# Patient Record
Sex: Female | Born: 1960 | Race: White | Hispanic: No | Marital: Married | Smoking: Never smoker
Health system: Southern US, Community
[De-identification: ages and names within clinical notes are randomized; demographics above are authoritative.]

## PROBLEM LIST (undated history)

## (undated) DIAGNOSIS — F419 Anxiety disorder, unspecified: Secondary | ICD-10-CM

## (undated) DIAGNOSIS — K5090 Crohn's disease, unspecified, without complications: Secondary | ICD-10-CM

## (undated) DIAGNOSIS — G709 Myoneural disorder, unspecified: Secondary | ICD-10-CM

## (undated) DIAGNOSIS — D649 Anemia, unspecified: Secondary | ICD-10-CM

## (undated) DIAGNOSIS — F32A Depression, unspecified: Secondary | ICD-10-CM

## (undated) DIAGNOSIS — F329 Major depressive disorder, single episode, unspecified: Secondary | ICD-10-CM

## (undated) DIAGNOSIS — M1990 Unspecified osteoarthritis, unspecified site: Secondary | ICD-10-CM

## (undated) HISTORY — PX: APPENDECTOMY: SHX54

## (undated) HISTORY — DX: Anxiety disorder, unspecified: F41.9

## (undated) HISTORY — PX: TUBAL LIGATION: SHX77

## (undated) HISTORY — DX: Unspecified osteoarthritis, unspecified site: M19.90

## (undated) HISTORY — DX: Depression, unspecified: F32.A

## (undated) HISTORY — PX: BREAST SURGERY: SHX581

## (undated) HISTORY — DX: Major depressive disorder, single episode, unspecified: F32.9

## (undated) HISTORY — DX: Anemia, unspecified: D64.9

## (undated) HISTORY — DX: Crohn's disease, unspecified, without complications: K50.90

## (undated) HISTORY — DX: Myoneural disorder, unspecified: G70.9

## (undated) HISTORY — PX: ABDOMINAL HYSTERECTOMY: SHX81

---

## 2015-04-11 ENCOUNTER — Ambulatory Visit

## 2015-04-11 ENCOUNTER — Ambulatory Visit (INDEPENDENT_AMBULATORY_CARE_PROVIDER_SITE_OTHER): Admitting: Family Medicine

## 2015-04-11 VITALS — BP 114/80 | HR 104 | Temp 98.4°F | Resp 18 | Ht 69.0 in | Wt 170.0 lb

## 2015-04-11 DIAGNOSIS — M542 Cervicalgia: Secondary | ICD-10-CM | POA: Diagnosis not present

## 2015-04-11 DIAGNOSIS — M25522 Pain in left elbow: Secondary | ICD-10-CM | POA: Diagnosis not present

## 2015-04-11 DIAGNOSIS — S3992XA Unspecified injury of lower back, initial encounter: Secondary | ICD-10-CM

## 2015-04-11 DIAGNOSIS — M545 Low back pain, unspecified: Secondary | ICD-10-CM

## 2015-04-11 DIAGNOSIS — M25512 Pain in left shoulder: Secondary | ICD-10-CM

## 2015-04-11 DIAGNOSIS — M25529 Pain in unspecified elbow: Secondary | ICD-10-CM

## 2015-04-11 DIAGNOSIS — M25521 Pain in right elbow: Secondary | ICD-10-CM

## 2015-04-11 DIAGNOSIS — W19XXXA Unspecified fall, initial encounter: Secondary | ICD-10-CM

## 2015-04-11 MED ORDER — CYCLOBENZAPRINE HCL 5 MG PO TABS: ORAL_TABLET | ORAL | Status: DC

## 2015-04-11 MED ORDER — HYDROCODONE-ACETAMINOPHEN 5-325 MG PO TABS
1.0000 | ORAL_TABLET | Freq: Four times a day (QID) | ORAL | Status: DC | PRN
Start: 2015-04-11 — End: 2015-04-20

## 2015-04-11 NOTE — Patient Instructions (Signed)
Your shoulder x-ray, elbow x-rays, low back x-ray did not show any apparent fractures, but we will have the radiologist read these as well tonight. There is some arthritis in one area of your neck, with questionable movement around that area, but I will also have this evaluated by the radiologist tonight. For now, wear the shoulder sling on your left side, wear the cervical collar to help protect your neck in case there is an acute injury, and follow-up with me Thursday at 3 PM. If there is any change in this plan once the radiologist reads your x-rays, we will call you. If you have any worsening of your symptoms prior to follow-up visit, return here or the emergency room.  For now you can take Flexeril up to every 8 hours as needed for muscle spasm (do not combine this with Robaxin), then if needed for more severe pain, can take the hydrocodone. Be very careful as both these medicines can cause sedation and dizziness.

## 2015-04-11 NOTE — Progress Notes (Addendum)
Subjective:  By signing my name below, I, Rawaa Al Rifaie, attest that this documentation has been prepared under the direction and in the presence of [name], MD.  Broadus John, Medical Scribe. 04/11/2015.  3:40 PM. I personally performed the services described in this documentation, which was scribed in my presence. The recorded information has been reviewed and considered, and addended by me as needed.      Patient ID: Kari Rocha, female    DOB: 12-05-1960, 54 y.o.   MRN: [id number]  Chief Complaint  Patient presents with  . Fall    12/12  . Back Injury  . Neck Injury  . Shoulder Injury    both   . Elbow Injury    left worse than rt    HPI HPI Comments: Kari Rocha is a 54 y.o. female who presents to [facility] complaining of injuries secondary to a fall that occurred at work yesterday around 4 pm.  She indicates that as she was walking, she slid on wet leaves, she fell backwards and she first noticed pain to her tailbone. She did not need to call EMS or go to the hospital, rather she was able to go back to work. Pt notes that she was still able to walk and use her arms and neck after the onset of the injury. She indicates that she progressively started to experience tenderness to the lower back, left shoulder, left arm, BL elbow, left hip pain as she is raising her left leg, numbness to the third, fourth and fifth fingers on the right hand that started after few hours of the onset of injuries. She reports that she applied heat to her neck area which was the sorest area after the incident, as well as taking Robaxin 2 pill after onset of injury, and 2 dosages at night time, with very mild relief. Pt denies bowel or bladder incontinence, genitals numbness, nausea, vomiting, head impact at onset, or LOC. She reports no hx of surgeries or fractures to the back. Pt is right handed dominate.  Pt has hx of psoriatic arthritis that she usually experiences in  her hands. She denies previous issues to the currently affected areas with this disease. Pt also has a hx of Crohn's disease, theretofore she is not able to take NSAIDs.   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 Authorizing Provider  methocarbamol (ROBAXIN) 500 MG tablet Take 500 mg by mouth 3 (three) times daily.   Yes Historical Provider, MD    No Known Allergies   Review of Systems  Gastrointestinal: Negative for nausea and vomiting.  Genitourinary: Negative for enuresis.  Musculoskeletal: Positive for myalgias, back pain, arthralgias, neck pain and neck stiffness.  Neurological: Positive for numbness.      Objective:   Physical Exam  Constitutional: She is oriented to person, place, and time. She appears well-developed and well-nourished. No distress.  HENT:  Head: Normocephalic and atraumatic.  Eyes: EOM are normal. Pupils are equal, round, and reactive to light.  Neck: Neck supple.  Cardiovascular: Normal rate.   Pulmonary/Chest: Effort normal.  Musculoskeletal:  Cervical spine-Tender along the right and left paraspinal, right greater than left. Slight tend along the lower cervical spine midline. Guarded with flexion with only 35 degrees. Guarded with extension 30-40 degrees. 30-40 degrees with rotation to the left or right. Guarded with lateral flexion.  Lumbar  spine- tenderness of the lower L spine and the sacrum midline. Paraspinal tenderness of the L1-L2 BL, no spasms. Flexion is guarded to 40-45 degrees. Guarded with lateral flexion. Rotation is equal. Negative seated straight leg raise. Skin is intact. No bruising. Left shoulder- [place] joint, clavicle, and AC joint are non-tender. Tender along the anterior shoulder soft tissue. Skin is intact, no erythema or ecchymosis. Minimal flexion approximately 30-35 degrees. Pain free with extension. Approximately 35 degrees with abduction, otherwise  guarded exam.  Right shoulder - Pain free ROM. No bony tenderness.  Left Elbow- FROM. Tender along the medial epicondyle, remainder of the elbow is non tender.  Right elbow- FROM. Slight bony tenderness along the medial epicondyle.  Left hand- Equal grip strength. Wrist is non tender. Able to heel and toe walk without difficulty.  Neurological: She is alert and oriented to person, place, and time. No cranial nerve deficit. She displays no Babinski's sign on the right side. She displays no Babinski's sign on the left side.  Reflex Scores:      Tricep reflexes are 2+ on the right side and 2+ on the left side.      Bicep reflexes are 2+ on the right side and 2+ on the left side.      Brachioradialis reflexes are 2+ on the right side and 2+ on the left side.      Patellar reflexes are 2+ on the right side and 2+ on the left side.      Achilles reflexes are 2+ on the right side and 2+ on the left side. nvi distally to fingertips.   Skin: Skin is warm and dry.  Psychiatric: She has a normal mood and affect. Her behavior is normal.  Nursing note and vitals reviewed.   Filed Vitals:   04/11/15 1457  BP: 114/80  Pulse: 104  Temp: 98.4 F (36.9 C)  TempSrc: Oral  Resp: 18  Height: 5\' 9"  (1.753 m)  Weight: 170 lb (77.111 kg)  SpO2: 97%    [facility] (PRIMARY) x-ray report read by Dr. Meredith StaggersJeffrey Bernadett Milian, MD:  Cervical spine with flexion/ extension views- Degenerative changes with decreased dics space C5-6. No appreciable movement with flexion and extension views but subtle possible listhesis C4 on 5.  Lumbar spine- lower degenerative changes, and decreased disc space L3-4 L4-5 with slight retrolisthesis of L3 on L4.  Pelvic/ Coccyx- Difficult to visualize, but no apparent displaced coccyx fracture.  Left shoulder- There are radioopaque patches through the upper humerus, but no apparent fracture at the glenohumeral joint.  Right elbow- No apparent fracture.  Left elbow- No apparent fracture.        Assessment & Plan:   Kari Rocha is a 54 y.o. female Fall, initial encounter - Plan: HYDROcodone-acetaminophen (NORCO/VICODIN) 5-325 MG tablet  -Due to injury at work. Minimal initial symptoms, was able to continue to work for first hour 2, which is reassuring, however progression of pain to her shoulder,  Neck, elbows and low back/tailbone.   Bilateral elbow joint pain, unspecified laterality - Plan: DG Elbow Complete Right, DG Elbow Complete Left, HYDROcodone-acetaminophen (NORCO/VICODIN) 5-325 MG tablet  -No apparent fracture. Symptomatic care.  Left shoulder pain - Plan: DG Shoulder Left, Care order/instruction, HYDROcodone-acetaminophen (NORCO/VICODIN) 5-325 MG tablet  -Strain of RTC possible. No apparent fracture on initial x-ray. Shoulder sling placed, recheck in next 1-2 days. Hydrocodone if needed for breakthrough pain.  Neck pain - Plan: DG Cervical Spine Complete, DG Cerv Spine Flex&Ext Only, cyclobenzaprine (FLEXERIL) 5 MG tablet,  HYDROcodone-acetaminophen (NORCO/VICODIN) 5-325 MG tablet  -Suspected underlying degenerative disease, but report was sent to radiology, placed in rigid neck collar for now. Flexeril prescribed if needed, and hydrocodone for breakthrough pain. Side effects discussed and cautioned on combining these 2 medications. Do not take flexeril with robaxin.  Recheck in the next 2 days.  Bilateral low back pain without sciatica - Plan: DG Lumbar Spine Complete, HYDROcodone-acetaminophen (NORCO/VICODIN) 5-325 MG tablet Tailbone injury, initial encounter - Plan: DG Sacrum/Coccyx, HYDROcodone-acetaminophen (NORCO/VICODIN) 5-325 MG tablet  -Coccydynia/tailbone contusion likely with secondary lumbar strain from fall. Symptomatic care with Flexeril, hydrocodone only if needed.  Recheck in 2 days.   Out of work at present due to multiple injuries as above and sedating medications prescribed, and recheck in 2 days. Letter provided.   Meds ordered this encounter   Medications  . cyclobenzaprine (FLEXERIL) 5 MG tablet    Sig: 1 pill by mouth up to every 8 hours as needed. Start with one pill by mouth each bedtime as needed due to sedation    Dispense:  15 tablet    Refill:  0  . HYDROcodone-acetaminophen (NORCO/VICODIN) 5-325 MG tablet    Sig: Take 1 tablet by mouth every 6 (six) hours as needed for moderate pain.    Dispense:  15 tablet    Refill:  0   Patient Instructions  Your shoulder x-ray, elbow x-rays, low back x-ray did not show any apparent fractures, but we will have the radiologist read these as well tonight. There is some arthritis in one area of your neck, with questionable movement around that area, but I will also have this evaluated by the radiologist tonight. For now, wear the shoulder sling on your left side, wear the cervical collar to help protect your neck in case there is an acute injury, and follow-up with me Thursday at 3 PM. If there is any change in this plan once the radiologist reads your x-rays, we will call you. If you have any worsening of your symptoms prior to follow-up visit, return here or the emergency room.  For now you can take Flexeril up to every 8 hours as needed for muscle spasm (do not combine this with Robaxin), then if needed for more severe pain, can take the hydrocodone. Be very careful as both these medicines can cause sedation and dizziness.      I personally performed the services described in this documentation, which was scribed in my presence. The recorded information has been reviewed and considered, and addended by me as needed.

## 2015-04-13 ENCOUNTER — Ambulatory Visit (INDEPENDENT_AMBULATORY_CARE_PROVIDER_SITE_OTHER): Admitting: Family Medicine

## 2015-04-13 VITALS — BP 118/72 | HR 100 | Temp 98.1°F | Resp 18 | Ht 69.0 in | Wt 172.0 lb

## 2015-04-13 DIAGNOSIS — S5000XD Contusion of unspecified elbow, subsequent encounter: Secondary | ICD-10-CM

## 2015-04-13 DIAGNOSIS — M25512 Pain in left shoulder: Secondary | ICD-10-CM | POA: Diagnosis not present

## 2015-04-13 DIAGNOSIS — S3992XD Unspecified injury of lower back, subsequent encounter: Secondary | ICD-10-CM

## 2015-04-13 DIAGNOSIS — S161XXD Strain of muscle, fascia and tendon at neck level, subsequent encounter: Secondary | ICD-10-CM

## 2015-04-13 NOTE — Patient Instructions (Signed)
Continue to wear the sling for your left shoulder.  I will refer you to orthopedics to evaluate your shoulder, neck, tailbone injuries further. You may need to have an MRI or other imaging of some of these areas to determine cause of the pain. For now continue the hydrocodone up to every 6 hours as we discussed, Flexeril if needed up to every 8 hours, be careful combining these 2 medicines. Should not drive or operate heavy machinery if you are taking these medicines.  Continue wear the neck brace as needed for neck pain, but if it is not hurting as much, gentle range of motion out of the neck brace a few times per day if tolerated. If any increasing pain, leave the neck brace in place until you're seen by the orthopedist.  Return to the clinic or go to the nearest emergency room if any of your symptoms worsen or new symptoms occur.  Shoulder Pain The shoulder is the joint that connects your arms to your body. The bones that form the shoulder joint include the upper arm bone (humerus), the shoulder blade (scapula), and the collarbone (clavicle). The top of the humerus is shaped like a ball and fits into a rather flat socket on the scapula (glenoid cavity). A combination of muscles and strong, fibrous tissues that connect muscles to bones (tendons) support your shoulder joint and hold the ball in the socket. Small, fluid-filled sacs (bursae) are located in different areas of the joint. They act as cushions between the bones and the overlying soft tissues and help reduce friction between the gliding tendons and the bone as you move your arm. Your shoulder joint allows a wide range of motion in your arm. This range of motion allows you to do things like scratch your back or throw a ball. However, this range of motion also makes your shoulder more prone to pain from overuse and injury. Causes of shoulder pain can originate from both injury and overuse and usually can be grouped in the following four  categories:  Redness, swelling, and pain (inflammation) of the tendon (tendinitis) or the bursae (bursitis).  Instability, such as a dislocation of the joint.  Inflammation of the joint (arthritis).  Broken bone (fracture). HOME CARE INSTRUCTIONS   Apply ice to the sore area.  Put ice in a plastic bag.  Place a towel between your skin and the bag.  Leave the ice on for 15-20 minutes, 3-4 times per day for the first 2 days, or as directed by your health care provider.  Stop using cold packs if they do not help with the pain.  If you have a shoulder sling or immobilizer, wear it as long as your caregiver instructs. Only remove it to shower or bathe. Move your arm as little as possible, but keep your hand moving to prevent swelling.  Squeeze a soft ball or foam pad as much as possible to help prevent swelling.  Only take over-the-counter or prescription medicines for pain, discomfort, or fever as directed by your caregiver. SEEK MEDICAL CARE IF:   Your shoulder pain increases, or new pain develops in your arm, hand, or fingers.  Your hand or fingers become cold and numb.  Your pain is not relieved with medicines. SEEK IMMEDIATE MEDICAL CARE IF:   Your arm, hand, or fingers are numb or tingling.  Your arm, hand, or fingers are significantly swollen or turn white or blue. MAKE SURE YOU:   Understand these instructions.  Will watch your condition.  Will get help right away if you are not doing well or get worse.   This information is not intended to replace advice given to you by your health care provider. Make sure you discuss any questions you have with your health care provider.   Document Released: 01/23/2005 Document Revised: 05/06/2014 Document Reviewed: 08/08/2014 Elsevier Interactive Patient Education 2016 [employer]  Tailbone Injury The tailbone (coccyx) is the small bone at the lower end of the spine. A tailbone injury may involve stretched ligaments,  bruising, or a broken bone (fracture). Tailbone injuries can be painful, and some may take a long time to heal. CAUSES This condition is often caused by falling and landing on the tailbone. Other causes include:  Repeated strain or friction from actions such as rowing and bicycling.  Childbirth. In some cases, the cause may not be known. RISK FACTORS This condition is more common in women than in men. SYMPTOMS Symptoms of this condition include:  Pain in the lower back, especially when sitting.  Pain or difficulty when standing up from a sitting position.  Bruising in the tailbone area.  Painful bowel movements.  In women, pain during intercourse. DIAGNOSIS This condition may be diagnosed based on your symptoms and a physical exam. X-rays may be taken if a fracture is suspected. You may also have other tests, such as a CT scan or MRI. TREATMENT This condition may be treated with medicines to help relieve your pain. Most tailbone injuries heal on their own in 4-6 weeks. However, recovery time may be longer if the injury involves a fracture. HOME CARE INSTRUCTIONS  Take medicines only as directed by your health care provider.  If directed, apply ice to the injured area:  Put ice in a plastic bag.  Place a towel between your skin and the bag.  Leave the ice on for 20 minutes, 2-3 times per day for the first 1-2 days.  Sit on a large, rubber or inflated ring or cushion to ease your pain. Lean forward when you are sitting to help decrease discomfort.  Avoid sitting for long periods of time.  Increase your activity as the pain allows. Perform any exercises that are recommended by your health care provider or physical therapist.  If you have pain during bowel movements, use stool softeners as directed by your health care provider.  Eat a diet that includes plenty of fiber to help prevent constipation.  Keep all follow-up visits as directed by your health care provider. This  is important. PREVENTION Wear appropriate padding and sports gear when bicycling and rowing. This can help to prevent developing an injury that is caused by repeated strain or friction. SEEK MEDICAL CARE IF:  Your pain becomes worse.  Your bowel movements cause a great deal of discomfort.  You are unable to have a bowel movement.  You have uncontrolled urine loss (urinary incontinence).  You have a fever.   This information is not intended to replace advice given to you by your health care provider. Make sure you discuss any questions you have with your health care provider.   Document Released: 04/12/2000 Document Revised: 08/30/2014 Document Reviewed: 04/11/2014 Elsevier Interactive Patient Education [employer]

## 2015-04-13 NOTE — Progress Notes (Signed)
Subjective:  This chart was scribed for Kari Staggers, MD by Stann Ore, Medical Scribe. This patient was seen in room 1 and the patient's care was started 4:20 PM.   Patient ID: Kari Rocha, female    DOB: 1960/08/06, 54 y.o.   MRN: [id number] Chief Complaint  Patient presents with  . Follow-up  . Neck Injury    HPI Kari Rocha is a 54 y.o. female Pt here for recheck, see last office visit 2 days ago after fall at work. She had multiple areas of pain at that time, including neck, bilateral elbows, left shoulder, low back, and tailbone. 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 She was initially placed in rigid neck brace, prescribed flexeril and hydrocodone, and placed out of work.   Medications She had her medications filled today. She had taken a half tablet of hydrocodone today due to the need to drive. She did try a tablet of her robaxin last night but without significant relief.   Neck Pain She's taken off her brace for a few hours last night. She still feels some pressure in her neck. She still has trouble moving her head.   Left Shoulder She was placed in a sling for her left shoulder. She's taken the sling off last night and it still hurts. She states that it feels worse and noticed more pain last night. She can't lift her left arm up at all.   Elbows Her elbows still feel very sore. The soreness is still about the same since 2 days ago.   Tailbone She's been sitting on the couch at home. It hurts most when she leans over while sitting. She denies blood in stool or bowel incontinence.   No Known Allergies   Prior to Admission  medications   Medication Sig Start Date End Date Taking? Authorizing Provider  amitriptyline (ELAVIL) 25 MG tablet Take 25 mg by mouth at bedtime.   Yes Historical Provider, MD  cyclobenzaprine (FLEXERIL) 5 MG tablet 1 pill by mouth up to every 8 hours as needed. Start with one pill by mouth each bedtime as needed due to sedation 04/11/15  Yes Shade Flood, MD  FLUoxetine (PROZAC) 20 MG tablet Take 20 mg by mouth daily.   Yes Historical Provider, MD  HYDROcodone-acetaminophen (NORCO/VICODIN) 5-325 MG tablet Take 1 tablet by mouth every 6 (six) hours as needed for moderate pain. 04/11/15  Yes Shade Flood, MD  methocarbamol (ROBAXIN) 500 MG tablet Take 500 mg by mouth 3 (three) times daily.   Yes Historical Provider, MD  methotrexate (RHEUMATREX) 2.5 MG tablet Take 2.5 mg by mouth once a week. Caution:Chemotherapy. Protect from light. Takes 12.5mg  every week   Yes Historical Provider, MD    Review of Systems  Gastrointestinal: Negative for diarrhea, constipation and blood in stool.  Musculoskeletal: Positive for myalgias (left arm), back pain, arthralgias (left shoulder), neck pain and neck stiffness. Negative for joint swelling.  Skin: Negative for rash and wound.  Neurological: Negative for weakness and headaches.       Objective:   Physical Exam  Constitutional: She is oriented to person, place, and time. She appears well-developed and well-nourished. No distress.  HENT:  Head: Normocephalic and atraumatic.  Eyes: EOM are normal. Pupils are equal, round, and reactive to light.  Neck: Neck supple.  Cardiovascular: Normal rate.   Pulmonary/Chest: Effort normal. No respiratory distress.  Musculoskeletal: Normal range of motion.  Diffusely tender along midline neck, mostly notably in upper cervical spine, tender in paraspinal muscles, left greater than right at neck, guarded with pain with light touch over left trapezius  Neck: extension 5-10 degrees, flexion 5-10 degrees, right  rotation 10-15 degrees, left rotation 10-15 degrees, lateral right flexion 5-10 degrees, lateral left flexion 5-10 degrees, guarded exam diffusely  Some anterior swelling of left shoulder, no ecchymosis, skin intact, diffusely tender over anterior of left shoulder, ST joint clavicle non tender, tenderness along AC joint, guarded left shoulder with any rom  Left elbow: no focal bony tenderness, pain free ROM in elbow  Right elbow: minimal tenderness, full rom, skin intact, no bruising of medial epicondyl  Left hand, 3rd-5th fingers: sensation intact with weight, cap refill <1sec in all finger tips, radial pulses 2+  Lumbar: no midline paraspinal tenderness, slightly decreased rom due to tailbone pain, sacrum non tender, discomfort along coccyx  Neurological: She is alert and oriented to person, place, and time.  Skin: Skin is warm and dry.  Psychiatric: She has a normal mood and affect. Her behavior is normal.  Nursing note and vitals reviewed.   Filed Vitals:   04/13/15 1534  BP: 118/72  Pulse: 100  Temp: 98.1 F (36.7 C)  TempSrc: Oral  Resp: 18  Height: 5\' 9"  (1.753 m)  Weight: 172 lb (78.019 kg)  SpO2: 98%      Assessment & Plan:   [name] is a 54 y.o. female Left anterior shoulder pain - Plan: Ambulatory referral to Orthopedic Surgery  -Pain due to fall while at work as above. Still very guarded with movement of her left shoulder, there is no apparent fracture or dislocation initially. Some possible anterior swelling, may have some component of bursal swelling but with guarded exam, suspicious for rotator cuff injury. She was able to use the arm initially briefly, but worsened.  -Refer to orthopedics for further evaluation or imaging. Continue sling for now. Has hydrocodone if needed.  Neck strain, subsequent encounter - Plan: Ambulatory referral to Orthopedic Surgery  -Stable. No fracture or apparent instability on initial x-rays. Has collar to wear as needed for  comfort until she is followed up with orthopedics. If persistent pain, may need further imaging at their discretion.. Flexeril or hydrocodone as needed. RTC/ER precautions.   Elbow contusion, unspecified laterality, subsequent encounter - Plan: Ambulatory referral to Orthopedic Surgery  -Improved. No fracture seen on initial x-rays. Symptomatic care discussed.  Tailbone injury, subsequent encounter - Plan: Ambulatory referral to Orthopedic Surgery  -No apparent fracture on initial x-ray, but possible occult coccyx fracture versus contusion with secondary coccydynia.  Inflatable ring cushion pad with cut out discussed for when she is seated. Handout given as below.  Out of work for now until seen by orthopedics given the extent of injuries and sedating medications prescribed.   No orders of the defined types were placed in this encounter.   Patient Instructions  Continue to wear the sling for your left shoulder.  I will refer you to orthopedics to evaluate your shoulder, neck, tailbone injuries further. You may need to have an MRI or other imaging of some of these areas to determine cause of the pain. For now continue the hydrocodone up to every 6 hours as we discussed, Flexeril if needed  up to every 8 hours, be careful combining these 2 medicines. Should not drive or operate heavy machinery if you are taking these medicines.  Continue wear the neck brace as needed for neck pain, but if it is not hurting as much, gentle range of motion out of the neck brace a few times per day if tolerated. If any increasing pain, leave the neck brace in place until you're seen by the orthopedist.  Return to the clinic or go to the nearest emergency room if any of your symptoms worsen or new symptoms occur.  Shoulder Pain The shoulder is the joint that connects your arms to your body. The bones that form the shoulder joint include the upper arm bone (humerus), the shoulder blade (scapula), and the collarbone  (clavicle). The top of the humerus is shaped like a ball and fits into a rather flat socket on the scapula (glenoid cavity). A combination of muscles and strong, fibrous tissues that connect muscles to bones (tendons) support your shoulder joint and hold the ball in the socket. Small, fluid-filled sacs (bursae) are located in different areas of the joint. They act as cushions between the bones and the overlying soft tissues and help reduce friction between the gliding tendons and the bone as you move your arm. Your shoulder joint allows a wide range of motion in your arm. This range of motion allows you to do things like scratch your back or throw a ball. However, this range of motion also makes your shoulder more prone to pain from overuse and injury. Causes of shoulder pain can originate from both injury and overuse and usually can be grouped in the following four categories:  Redness, swelling, and pain (inflammation) of the tendon (tendinitis) or the bursae (bursitis).  Instability, such as a dislocation of the joint.  Inflammation of the joint (arthritis).  Broken bone (fracture). HOME CARE INSTRUCTIONS   Apply ice to the sore area.  Put ice in a plastic bag.  Place a towel between your skin and the bag.  Leave the ice on for 15-20 minutes, 3-4 times per day for the first 2 days, or as directed by your health care provider.  Stop using cold packs if they do not help with the pain.  If you have a shoulder sling or immobilizer, wear it as long as your caregiver instructs. Only remove it to shower or bathe. Move your arm as little as possible, but keep your hand moving to prevent swelling.  Squeeze a soft ball or foam pad as much as possible to help prevent swelling.  Only take over-the-counter or prescription medicines for pain, discomfort, or fever as directed by your caregiver. SEEK MEDICAL CARE IF:   Your shoulder pain increases, or new pain develops in your arm, hand, or  fingers.  Your hand or fingers become cold and numb.  Your pain is not relieved with medicines. SEEK IMMEDIATE MEDICAL CARE IF:   Your arm, hand, or fingers are numb or tingling.  Your arm, hand, or fingers are significantly swollen or turn white or blue. MAKE SURE YOU:   Understand these instructions.  Will watch your condition.  Will get help right away if you are not doing well or get worse.   This information is not intended to replace advice given to you by your health care provider. Make sure you discuss any questions you have with your health care provider.   Document Released: 01/23/2005 Document Revised: 05/06/2014 Document Reviewed: 08/08/2014 Elsevier Interactive Patient  Education 2016 [employer].  Tailbone Injury The tailbone (coccyx) is the small bone at the lower end of the spine. A tailbone injury may involve stretched ligaments, bruising, or a broken bone (fracture). Tailbone injuries can be painful, and some may take a long time to heal. CAUSES This condition is often caused by falling and landing on the tailbone. Other causes include:  Repeated strain or friction from actions such as rowing and bicycling.  Childbirth. In some cases, the cause may not be known. RISK FACTORS This condition is more common in women than in men. SYMPTOMS Symptoms of this condition include:  Pain in the lower back, especially when sitting.  Pain or difficulty when standing up from a sitting position.  Bruising in the tailbone area.  Painful bowel movements.  In women, pain during intercourse. DIAGNOSIS This condition may be diagnosed based on your symptoms and a physical exam. X-rays may be taken if a fracture is suspected. You may also have other tests, such as a CT scan or MRI. TREATMENT This condition may be treated with medicines to help relieve your pain. Most tailbone injuries heal on their own in 4-6 weeks. However, recovery time may be longer if the injury  involves a fracture. HOME CARE INSTRUCTIONS  Take medicines only as directed by your health care provider.  If directed, apply ice to the injured area:  Put ice in a plastic bag.  Place a towel between your skin and the bag.  Leave the ice on for 20 minutes, 2-3 times per day for the first 1-2 days.  Sit on a large, rubber or inflated ring or cushion to ease your pain. Lean forward when you are sitting to help decrease discomfort.  Avoid sitting for long periods of time.  Increase your activity as the pain allows. Perform any exercises that are recommended by your health care provider or physical therapist.  If you have pain during bowel movements, use stool softeners as directed by your health care provider.  Eat a diet that includes plenty of fiber to help prevent constipation.  Keep all follow-up visits as directed by your health care provider. This is important. PREVENTION Wear appropriate padding and sports gear when bicycling and rowing. This can help to prevent developing an injury that is caused by repeated strain or friction. SEEK MEDICAL CARE IF:  Your pain becomes worse.  Your bowel movements cause a great deal of discomfort.  You are unable to have a bowel movement.  You have uncontrolled urine loss (urinary incontinence).  You have a fever.   This information is not intended to replace advice given to you by your health care provider. Make sure you discuss any questions you have with your health care provider.   Document Released: 04/12/2000 Document Revised: 08/30/2014 Document Reviewed: 04/11/2014 Elsevier Interactive Patient Education [employer]         By signing my name below, I, Stann Ore, attest that this documentation has been prepared under the direction and in the presence of Kari Staggers, MD. Electronically Signed: Stann Ore, Scribe. 04/13/2015 , 4:20 PM .  I personally performed the services described in this  documentation, which was scribed in my presence. The recorded information has been reviewed and considered, and addended by me as needed.

## 2015-04-15 ENCOUNTER — Encounter: Payer: Self-pay | Admitting: Family Medicine

## 2015-04-18 ENCOUNTER — Telehealth: Payer: Self-pay

## 2015-04-18 ENCOUNTER — Other Ambulatory Visit: Payer: Self-pay | Admitting: Family Medicine

## 2015-04-18 DIAGNOSIS — M25529 Pain in unspecified elbow: Secondary | ICD-10-CM

## 2015-04-18 DIAGNOSIS — M25512 Pain in left shoulder: Secondary | ICD-10-CM

## 2015-04-18 DIAGNOSIS — M542 Cervicalgia: Secondary | ICD-10-CM

## 2015-04-18 DIAGNOSIS — M545 Low back pain, unspecified: Secondary | ICD-10-CM

## 2015-04-18 DIAGNOSIS — W19XXXA Unspecified fall, initial encounter: Secondary | ICD-10-CM

## 2015-04-18 DIAGNOSIS — S3992XA Unspecified injury of lower back, initial encounter: Secondary | ICD-10-CM

## 2015-04-18 NOTE — Telephone Encounter (Signed)
Pt is needing a refill on her hydrocodone and to make sure that it goes to [facility]

## 2015-04-18 NOTE — Telephone Encounter (Signed)
Workers comp probably has not approved the ortho visit yet according to referral notes.

## 2015-04-19 NOTE — Telephone Encounter (Signed)
Ok for refill for now as ortho eval pending. Norco 5/325mg  Q6hr prn, no refill.  Will route to PA pool to assist in refill as I am out of town.

## 2015-04-20 ENCOUNTER — Ambulatory Visit (INDEPENDENT_AMBULATORY_CARE_PROVIDER_SITE_OTHER): Admitting: Physician Assistant

## 2015-04-20 VITALS — BP 118/72 | HR 78 | Temp 98.1°F | Resp 16 | Ht 68.0 in | Wt 177.0 lb

## 2015-04-20 DIAGNOSIS — M25512 Pain in left shoulder: Secondary | ICD-10-CM | POA: Diagnosis not present

## 2015-04-20 DIAGNOSIS — S3992XD Unspecified injury of lower back, subsequent encounter: Secondary | ICD-10-CM | POA: Diagnosis not present

## 2015-04-20 DIAGNOSIS — M25529 Pain in unspecified elbow: Secondary | ICD-10-CM | POA: Diagnosis not present

## 2015-04-20 DIAGNOSIS — S161XXD Strain of muscle, fascia and tendon at neck level, subsequent encounter: Secondary | ICD-10-CM

## 2015-04-20 MED ORDER — HYDROCODONE-ACETAMINOPHEN 5-325 MG PO TABS: 1.0000 | ORAL_TABLET | Freq: Four times a day (QID) | ORAL | Status: AC | PRN

## 2015-04-20 NOTE — Patient Instructions (Signed)
Please perform stretches as discussed.  Do them 3 times per day, each stretch 5 times during each one.  Follow this with ice.

## 2015-04-20 NOTE — Progress Notes (Signed)
Kari Rocha 08-03-1960 54 y.o.   Chief Complaint  Patient presents with  . Follow-up    Workers Comp, neck, shoulders, elbow, middle and lower back, and tailbone, x 10 days      Date of Injury: 04/10/2015  History of Present Illness:  Presents for evaluation of work-related complaint.  Patient reports that she is gettting a little more motion in the left arm.  Right arm hurts which she states she is over-compensating.  She is not working at this time.  Norco, is helping with her pain.  Sleeping ok with the medicine.   Shoulder is sore on right side down to the elbow. In the morning she feels head pain.  He is taking the flexeril at night only.  She has to meet up with doctor so she can not take it during the day.   -she has some improvement of her range of motion in her left shoulder appt with [facility] January 3rd.   -she notes that she has not done any stretches.    ROS ROS otherwise unremarkable unless listed above.   No Known Allergies   Current medications reviewed and updated. Past medical history, family history, social history have been reviewed and updated.   Physical Exam  Constitutional: She is oriented to person, place, and time and well-developed, well-nourished, and in no distress. No distress.  Cardiovascular: Normal rate and regular rhythm.  Exam reveals no gallop and no friction rub.   No murmur heard. Pulmonary/Chest: Effort normal. No respiratory distress. She has no wheezes.  Musculoskeletal: She exhibits no edema or tenderness.  Minimal cervical movement in all planes.  Shoulder with decreased range of motion in all planes.  Tenderness along the glenohumeral.  No spinous tenderness upon palpation.    Neurological: She is alert and oriented to person, place, and time.  Skin: Skin is warm and dry. She is not diaphoretic.  Psychiatric: Affect and judgment normal.     Assessment and Plan: 54 year old female is here today for follow up following a  fall obtained while working.  This cont Continue restriction from work.  Stretches given and demonstrated.  Advised ice directly after.   Continue medications Follow up in 7 days Bilateral elbow joint pain, unspecified laterality  Neck strain, subsequent encounter  Tailbone injury, subsequent encounter  Left shoulder pain  Trena PlattStephanie Ruben Mahler, PA-C [facility] 12/22/20164:19 PM

## 2015-04-20 NOTE — Telephone Encounter (Signed)
Notified pt on VM. 

## 2015-04-20 NOTE — Telephone Encounter (Signed)
Rx ready for pick up. 

## 2017-07-25 IMAGING — CR DG CERVICAL SPINE COMPLETE 4+V
7 series · 7 of 7 positions shown · non-contrast
Comparison: None.

CLINICAL DATA: Neck pain following fall yesterday, initial
encounter

EXAM:
CERVICAL SPINE - COMPLETE 4+ VIEW; CERVICAL SPINE - FLEXION AND
EXTENSION VIEWS

[lateral]
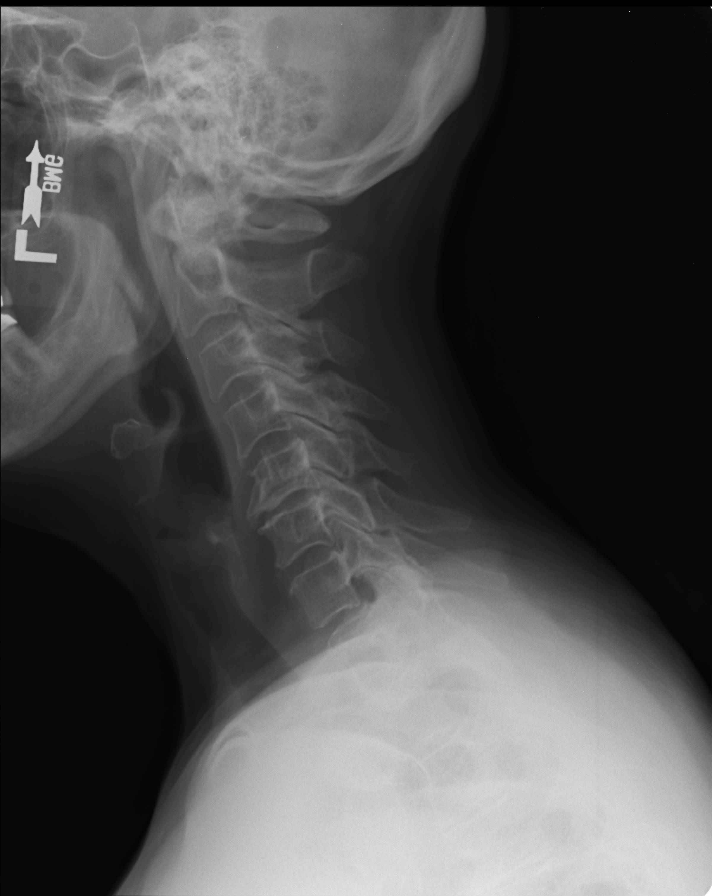

[ap open mouth]
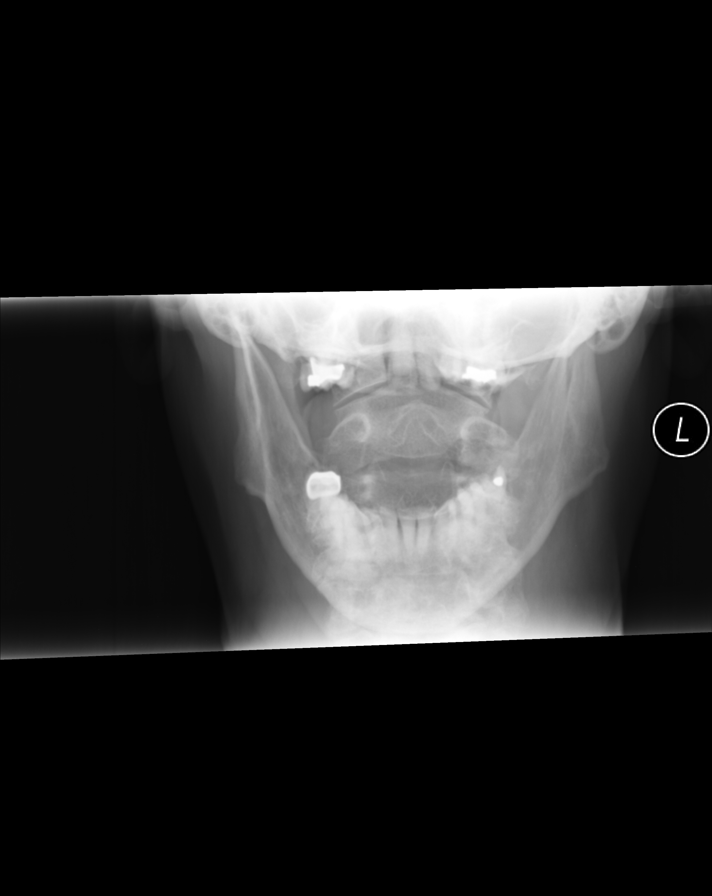

[AP (1 of 2)]
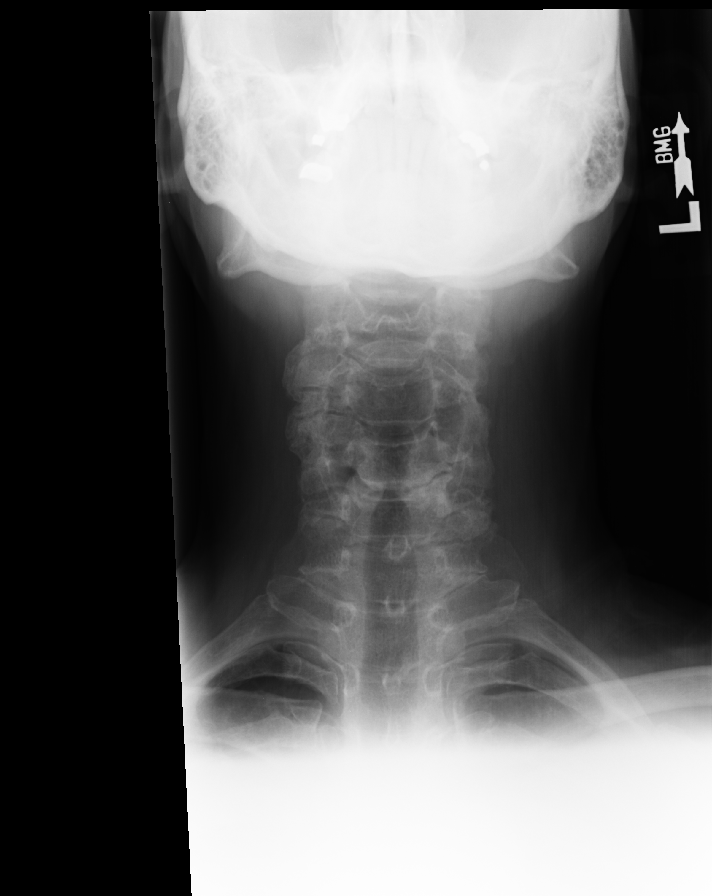

[swimmers]
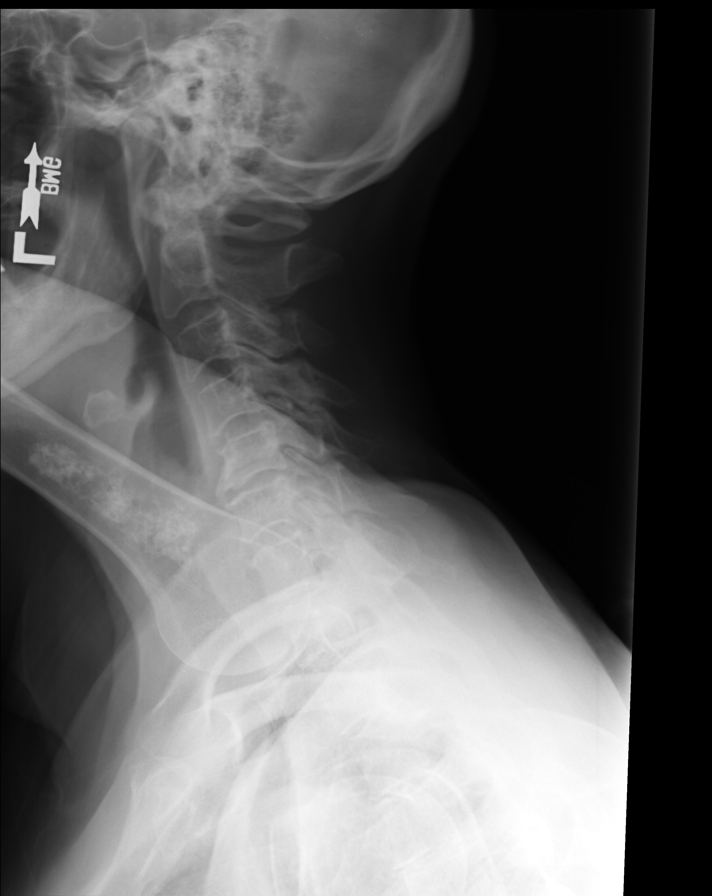

[lpo]
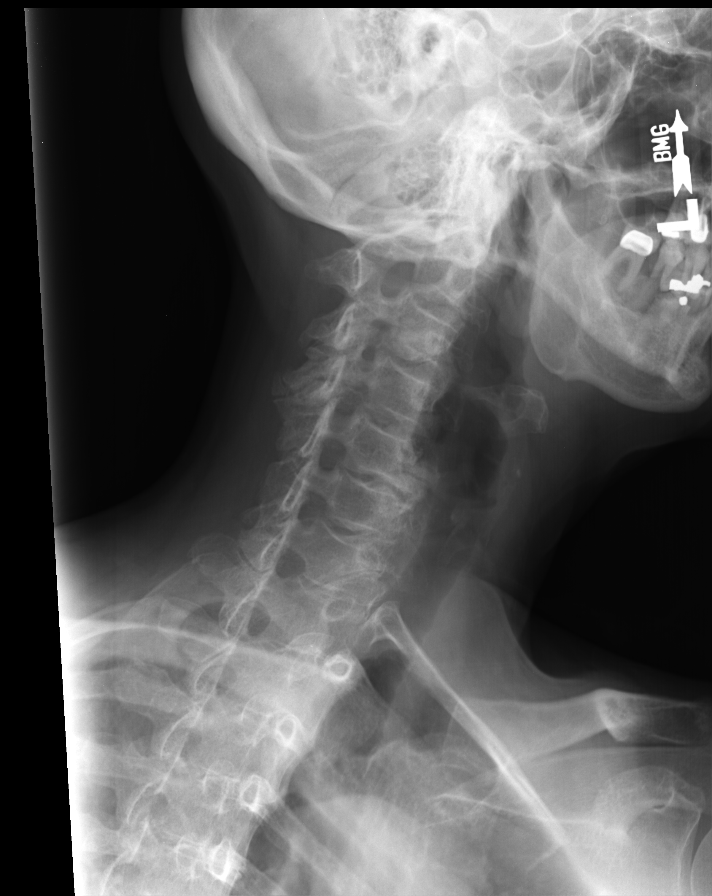

[rpo]
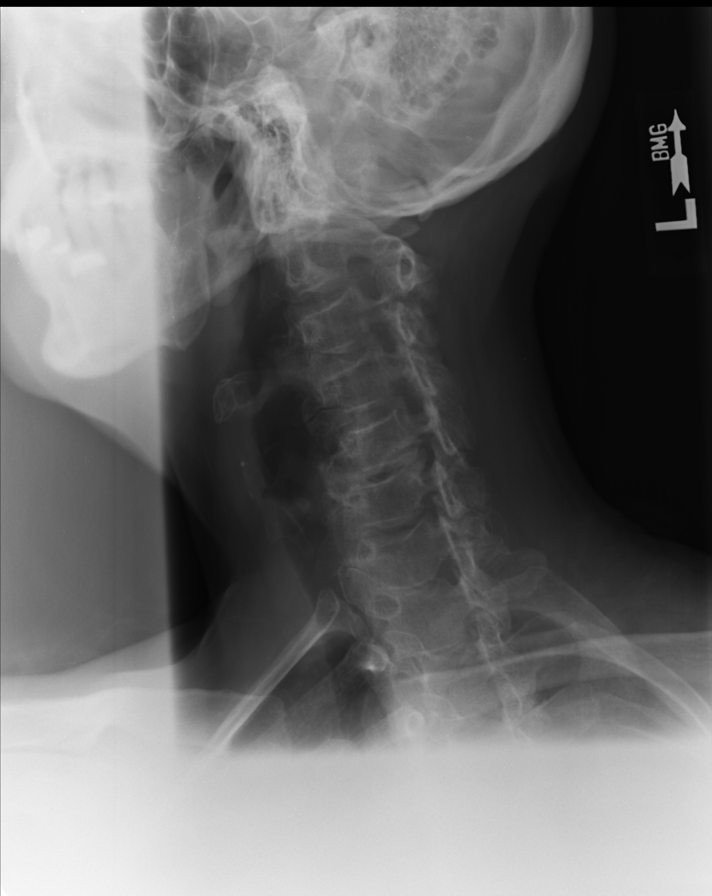

[AP (2 of 2)]
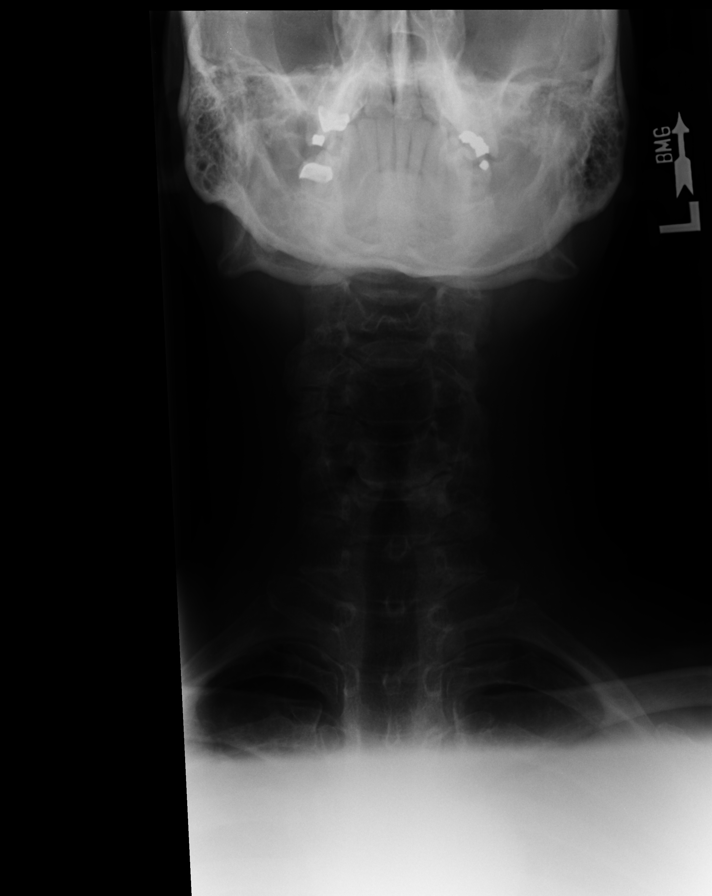

[7 of 7 positions shown; findings below may reference images not displayed]

FINDINGS: Seven cervical segments are well visualized. Mild osteophytic
changes are noted at C5-6 and C6-7. Flexion and extension views
showed no significant instability. No prevertebral soft tissue
changes are seen. The odontoid is within normal limits. Multilevel
facet hypertrophic changes are noted. No acute fracture is seen.
IMPRESSION: Degenerative change without acute abnormality.
